# Patient Record
Sex: Female | Born: 1995
Health system: Southern US, Community
[De-identification: ages and names within clinical notes are randomized; demographics above are authoritative.]

## PROBLEM LIST (undated history)

## (undated) HISTORY — PX: TONSILLECTOMY: SUR1361

## (undated) HISTORY — PX: ADENOIDECTOMY: SUR15

---

## 2016-10-25 ENCOUNTER — Inpatient Hospital Stay (HOSPITAL_COMMUNITY)
Admission: AD | Admit: 2016-10-25 | Discharge: 2016-10-26 | Disposition: A | Discharge status: Home / Self Care | Payer: 59 | Source: Ambulatory Visit | Attending: Family Medicine | Admitting: Family Medicine

## 2016-10-25 ENCOUNTER — Inpatient Hospital Stay (HOSPITAL_COMMUNITY): Payer: 59

## 2016-10-25 ENCOUNTER — Encounter (HOSPITAL_COMMUNITY): Payer: Self-pay | Admitting: *Deleted

## 2016-10-25 DIAGNOSIS — N946 Dysmenorrhea, unspecified: Secondary | ICD-10-CM | POA: Diagnosis not present

## 2016-10-25 DIAGNOSIS — R1031 Right lower quadrant pain: Secondary | ICD-10-CM | POA: Diagnosis present

## 2016-10-25 DIAGNOSIS — Z8742 Personal history of other diseases of the female genital tract: Secondary | ICD-10-CM

## 2016-10-25 DIAGNOSIS — R102 Pelvic and perineal pain: Secondary | ICD-10-CM | POA: Insufficient documentation

## 2016-10-25 DIAGNOSIS — G8929 Other chronic pain: Secondary | ICD-10-CM | POA: Insufficient documentation

## 2016-10-25 LAB — URINALYSIS, ROUTINE W REFLEX MICROSCOPIC
BILIRUBIN URINE: NEGATIVE
Bacteria, UA: NONE SEEN
GLUCOSE, UA: NEGATIVE mg/dL
Ketones, ur: NEGATIVE mg/dL
Leukocytes, UA: NEGATIVE
NITRITE: NEGATIVE
PROTEIN: NEGATIVE mg/dL
RBC / HPF: NONE SEEN RBC/hpf (ref 0–5)
SPECIFIC GRAVITY, URINE: 1 — AB (ref 1.005–1.030)
pH: 6 (ref 5.0–8.0)

## 2016-10-25 LAB — CBC
HEMATOCRIT: 36.6 % (ref 36.0–46.0)
Hemoglobin: 12.2 g/dL (ref 12.0–15.0)
MCH: 29.9 pg (ref 26.0–34.0)
MCHC: 33.3 g/dL (ref 30.0–36.0)
MCV: 89.7 fL (ref 78.0–100.0)
PLATELETS: 311 10*3/uL (ref 150–400)
RBC: 4.08 MIL/uL (ref 3.87–5.11)
RDW: 13.3 % (ref 11.5–15.5)
WBC: 8.2 10*3/uL (ref 4.0–10.5)

## 2016-10-25 NOTE — MAU Provider Note (Signed)
Chief Complaint: Abdominal Pain   First Provider Initiated Contact with Patient 10/25/16 2148      SUBJECTIVE HPI: Gloria Harper is a 21 y.o. G1P0010 who presents to maternity admissions reporting RLQ pain that is intermittent, starting 3-4 months ago.  This is a recurrent problem, usually occurring with her menses but also occurring in between bleeding. Her periods are irregular.  She had a Nexplanon that was removed 2 weeks ago by HD in [place]. She is not currently using contraception but is not sexually active.  She was seen initially 4 months ago and diagnosed with ovarian cysts and followed up with an OB/Gyn in [place]. She continues to have the pain so she became worried that something more serious could be wrong. She does not live in [place] but was visiting a friend so came to MAU.  She has tried Tylenol and ibuprofen, which do not resolve her pain and heat/warm bath which help but do not resolve the pain completely.  Having her period makes the pain worse.  It has no associated symptoms. She went to [facility] last night for this pain and had pelvic exam including STD testing. She also had recent Pap in her Ob/Gyn office which was normal per the pt. She denies vaginal bleeding, vaginal itching/burning, urinary symptoms, h/a, dizziness, n/v, or fever/chills.     HPI  History reviewed. No pertinent past medical history. Past Surgical History:  Procedure Laterality Date  . TONSILLECTOMY     Social History   Social History  . Marital status: Single    Spouse name: N/A  . Number of children: N/A  . Years of education: N/A   Occupational History  . Not on file.   Social History Main Topics  . Smoking status: Never Smoker  . Smokeless tobacco: Never Used  . Alcohol use Not on file  . Drug use: No  . Sexual activity: Not on file   Other Topics Concern  . Not on file   Social History Narrative  . No narrative on file   No current facility-administered medications  on file prior to encounter.    No current outpatient prescriptions on file prior to encounter.   No Known Allergies  ROS:  Review of Systems  Constitutional: Negative for chills, fatigue and fever.  Respiratory: Negative for shortness of breath.   Cardiovascular: Negative for chest pain.  Genitourinary: Positive for pelvic pain. Negative for difficulty urinating, dysuria, flank pain, vaginal bleeding, vaginal discharge and vaginal pain.  Neurological: Negative for dizziness and headaches.  Psychiatric/Behavioral: Negative.      I have reviewed patient's Past Medical Hx, Surgical Hx, Family Hx, Social Hx, medications and allergies.   Physical Exam   Patient Vitals for the past 24 hrs:  BP Temp Pulse Resp SpO2 Height Weight  10/25/16 2043 130/70 98.1 F (36.7 C) 91 18 100 % - -  10/25/16 2034 - - - - - 5\' 7"  (1.702 m) 220 lb (99.8 kg)   Constitutional: Well-developed, well-nourished female in no acute distress.  Cardiovascular: normal rate Respiratory: normal effort GI: Abd soft, non-tender. Pos BS x 4 MS: Extremities nontender, no edema, normal ROM Neurologic: Alert and oriented x 4.  GU: Neg CVAT.  PELVIC EXAM: Deferred--done yesterday at [facility] per pt including cultures. She also had recent Pap in her Ob/Gyn office.   LAB RESULTS Results for orders placed or performed during the hospital encounter of 10/25/16 (from the past 24 hour(s))  Urinalysis, Routine w reflex microscopic  Status: Abnormal   Collection Time: 10/25/16  8:29 PM  Result Value Ref Range   Color, Urine COLORLESS (A) YELLOW   APPearance CLEAR CLEAR   Specific Gravity, Urine 1.000 (L) 1.005 - 1.030   pH 6.0 5.0 - 8.0   Glucose, UA NEGATIVE NEGATIVE mg/dL   Hgb urine dipstick LARGE (A) NEGATIVE   Bilirubin Urine NEGATIVE NEGATIVE   Ketones, ur NEGATIVE NEGATIVE mg/dL   Protein, ur NEGATIVE NEGATIVE mg/dL   Nitrite NEGATIVE NEGATIVE   Leukocytes, UA NEGATIVE NEGATIVE   RBC / HPF NONE  SEEN 0 - 5 RBC/hpf   WBC, UA 0-5 0 - 5 WBC/hpf   Bacteria, UA NONE SEEN NONE SEEN   Squamous Epithelial / LPF 0-5 (A) NONE SEEN  CBC     Status: None   Collection Time: 10/25/16 10:25 PM  Result Value Ref Range   WBC 8.2 4.0 - 10.5 K/uL   RBC 4.08 3.87 - 5.11 MIL/uL   Hemoglobin 12.2 12.0 - 15.0 g/dL   HCT 11.936.6 14.736.0 - 82.946.0 %   MCV 89.7 78.0 - 100.0 fL   MCH 29.9 26.0 - 34.0 pg   MCHC 33.3 30.0 - 36.0 g/dL   RDW 56.213.3 13.011.5 - 86.515.5 %   Platelets 311 150 - 400 K/uL       IMAGING [place] Transvaginal Non-ob  Result Date: 10/25/2016 CLINICAL DATA:  History of ovarian cyst. Chronic pelvic pain. Nexplanon removed 2 weeks ago. EXAM: TRANSABDOMINAL AND TRANSVAGINAL ULTRASOUND OF PELVIS TECHNIQUE: Both transabdominal and transvaginal ultrasound examinations of the pelvis were performed. Transabdominal technique was performed for global imaging of the pelvis including uterus, ovaries, adnexal regions, and pelvic cul-de-sac. It was necessary to proceed with endovaginal exam following the transabdominal exam to visualize the ovaries. COMPARISON:  None FINDINGS: Uterus Measurements: 8.3 x 3.9 x 5.1 cm. Uterus is anteverted. No fibroids or other mass visualized. Endometrium Thickness: 2.5 mm.  No focal abnormality visualized. Right ovary Measurements: 3.8 x 2.7 x 2.9 cm. Normal follicular cysts demonstrated. Normal appearance/no adnexal mass. Left ovary Measurements: 3.8 x 2.8 x 2.9 cm. Normal appearance/no adnexal mass. Other findings No abnormal free fluid. Flow is demonstrated in both ovaries on color flow Doppler imaging. IMPRESSION: Normal ultrasound appearance of the uterus and ovaries. Electronically Signed   By: Burman NievesWilliam  Stevens M.D.   On: 10/25/2016 23:24   [place] Pelvis Complete  Result Date: 10/25/2016 CLINICAL DATA:  History of ovarian cyst. Chronic pelvic pain. Nexplanon removed 2 weeks ago. EXAM: TRANSABDOMINAL AND TRANSVAGINAL ULTRASOUND OF PELVIS TECHNIQUE: Both transabdominal and transvaginal  ultrasound examinations of the pelvis were performed. Transabdominal technique was performed for global imaging of the pelvis including uterus, ovaries, adnexal regions, and pelvic cul-de-sac. It was necessary to proceed with endovaginal exam following the transabdominal exam to visualize the ovaries. COMPARISON:  None FINDINGS: Uterus Measurements: 8.3 x 3.9 x 5.1 cm. Uterus is anteverted. No fibroids or other mass visualized. Endometrium Thickness: 2.5 mm.  No focal abnormality visualized. Right ovary Measurements: 3.8 x 2.7 x 2.9 cm. Normal follicular cysts demonstrated. Normal appearance/no adnexal mass. Left ovary Measurements: 3.8 x 2.8 x 2.9 cm. Normal appearance/no adnexal mass. Other findings No abnormal free fluid. Flow is demonstrated in both ovaries on color flow Doppler imaging. IMPRESSION: Normal ultrasound appearance of the uterus and ovaries. Electronically Signed   By: Burman NievesWilliam  Stevens M.D.   On: 10/25/2016 23:24    MAU Management/MDM: Ordered labs and US and reviewed results.  No evidence of ovarian cysts or  torsion on today's [place].  No acute abdomen, CBC wnl with no s/sx of infection.  Pain with each menses likely dysmenorrhea without known cause.  Pt to f/u with her OB/Gyn for further evaluation/management.  Pt to take ibuprofen OTC for pain. Pt stable at time of discharge.  ASSESSMENT 1. Dysmenorrhea   2. Chronic pelvic pain in female   3. Hx of ovarian cyst     PLAN Discharge home   Allergies as of 10/26/2016   No Known Allergies     Medication List    TAKE these medications   acetaminophen 500 MG tablet Commonly known as:  TYLENOL Take 500 mg by mouth every 6 (six) hours as needed.   meloxicam 7.5 MG tablet Commonly known as:  MOBIC Take 7.5 mg by mouth daily.   traMADol 50 MG tablet Commonly known as:  ULTRAM Take 100 mg by mouth every 6 (six) hours as needed.      Follow-up Information    Your Ob/Gyn provider in [place] Follow up.           Sharen Counter Certified Nurse-Midwife 10/26/2016  12:33 AM

## 2016-10-25 NOTE — MAU Note (Signed)
Pt was seen in [place] for pain. Pt had same symptoms as when she was diagnosed in October with ovarian cysts. She is wondering why she is getting these cysts and what kind of cysts they are. Pt took Tramadol a couple hours ago with only little bit of relief. Pt started her period on Friday. Pt had naxplanon taken out 2 weeks ago because it was making her cycles irregular.

## 2016-10-25 NOTE — MAU Note (Addendum)
Pt states over the weekend she was at [facility] and was told she had a large ovarian cyst-has hx of ovarian cyst. States she is having pain- rates 10/10. States she was given Chong Sicilianramadol-last took a couple hours ago, and two other medications for inflammation and vaginal bleeding. Pt states she is currently having some bleeding-small amount. LMP: 10/23/2016. Had Nexplanon removed by HD in [place] about 2 weeks ago.

## 2016-10-26 DIAGNOSIS — N946 Dysmenorrhea, unspecified: Secondary | ICD-10-CM

## 2016-10-26 DIAGNOSIS — R1031 Right lower quadrant pain: Secondary | ICD-10-CM

## 2021-01-20 ENCOUNTER — Other Ambulatory Visit: Payer: Self-pay

## 2021-01-20 ENCOUNTER — Ambulatory Visit
Admission: RE | Admit: 2021-01-20 | Discharge: 2021-01-20 | Disposition: A | Discharge status: Home / Self Care | Payer: 59 | Source: Ambulatory Visit | Attending: Emergency Medicine | Admitting: Emergency Medicine

## 2021-01-20 VITALS — BP 128/79 | HR 79 | Temp 98.4°F | Resp 16

## 2021-01-20 DIAGNOSIS — H6001 Abscess of right external ear: Secondary | ICD-10-CM | POA: Diagnosis not present

## 2021-01-20 DIAGNOSIS — R0981 Nasal congestion: Secondary | ICD-10-CM

## 2021-01-20 MED ORDER — FLUCONAZOLE 150 MG PO TABS: 150.0000 mg | ORAL_TABLET | Freq: Once | ORAL | 0 refills | Status: AC

## 2021-01-20 MED ORDER — AMOXICILLIN-POT CLAVULANATE 875-125 MG PO TABS: 1.0000 | ORAL_TABLET | Freq: Two times a day (BID) | ORAL | 0 refills | Status: AC

## 2021-01-20 MED ORDER — PSEUDOEPH-BROMPHEN-DM 30-2-10 MG/5ML PO SYRP: 10.0000 mL | ORAL_SOLUTION | Freq: Three times a day (TID) | ORAL | 0 refills | Status: AC | PRN

## 2021-01-20 MED ORDER — NEOMYCIN-POLYMYXIN-HC 3.5-10000-1 OT SUSP: 4.0000 [drp] | Freq: Three times a day (TID) | OTIC | 0 refills | Status: AC

## 2021-01-20 MED ORDER — FLUTICASONE PROPIONATE 50 MCG/ACT NA SUSP: 1.0000 | Freq: Every day | NASAL | 0 refills | Status: DC

## 2021-01-20 NOTE — Discharge Instructions (Signed)
Begin Cortisporin eardrops 3-4 times a day Continue Claritin, add in Flonase nasal spray 1 to 2 spray in each nostril daily May use cough syrup provided as needed for cough Tylenol and ibuprofen for swollen lymph nodes, ear pain, warm compresses If not seeing any improvement in symptoms with the above over the next 3 to 4 days may fill prescription for Augmentin  Please follow-up for any concerns or persistent symptoms

## 2021-01-20 NOTE — ED Triage Notes (Signed)
Pt c/o sinus pressure, nasal congestion, and rt ear pain x3 days. States feels like her lymph nodes are swelling to side of neck.

## 2021-01-20 NOTE — ED Provider Notes (Signed)
[facility]    CSN: [id number] Arrival date & time: 01/20/21  1854      History   Chief Complaint Chief Complaint  Patient presents with  . appt 7 sinus pressure    HPI Gloria Harper is a 25 y.o. female presenting today for evaluation of possible sinus infection.  Reports that over the past 3 days she has had discomfort in right ear with associated sinus pressure congestion and swollen lymph nodes.  She denies associated sore throat.  Denies known fevers.  Denies close sick contacts.  Reports history of sinus infections annually.  HPI  History reviewed. No pertinent past medical history.  There are no problems to display for this patient.   Past Surgical History:  Procedure Laterality Date  . TONSILLECTOMY      OB History    Gravida  1   Para      Term      Preterm      AB  1   Living        SAB  1   IAB      Ectopic      Multiple      Live Births  0            Home Medications    Prior to Admission medications   Medication Sig Start Date End Date Taking? Authorizing Provider  amoxicillin-clavulanate (AUGMENTIN) 875-125 MG tablet Take 1 tablet by mouth every 12 (twelve) hours for 7 days. 01/20/21 01/27/21 Yes Jakevion Arney C, PA-C  brompheniramine-pseudoephedrine-DM 30-2-10 MG/5ML syrup Take 10 mLs by mouth 3 (three) times daily as needed. 01/20/21  Yes Eliah Marquard C, PA-C  fluticasone (FLONASE) 50 MCG/ACT nasal spray Place 1-2 sprays into both nostrils daily. 01/20/21  Yes Mahaila Tischer C, PA-C  neomycin-polymyxin-hydrocortisone (CORTISPORIN) 3.5-10000-1 OTIC suspension Place 4 drops into the right ear 3 (three) times daily for 7 days. 01/20/21 01/27/21 Yes Sallyann Kinnaird, Junius Creamer, PA-C    Family History Family History  Problem Relation Age of Onset  . Hypertension Sister   . Stroke Sister     Social History Social History   Tobacco Use  . Smoking status: Never Smoker  . Smokeless tobacco: Never Used  Substance Use Topics  .  Alcohol use: Yes    Comment: occ  . Drug use: No     Allergies   Patient has no known allergies.   Review of Systems Review of Systems  Constitutional: Negative for activity change, appetite change, chills, fatigue and fever.  HENT: Positive for congestion, ear pain and sinus pressure. Negative for rhinorrhea, sore throat and trouble swallowing.   Eyes: Negative for discharge and redness.  Respiratory: Positive for cough. Negative for chest tightness and shortness of breath.   Cardiovascular: Negative for chest pain.  Gastrointestinal: Negative for abdominal pain, diarrhea, nausea and vomiting.  Musculoskeletal: Negative for myalgias.  Skin: Negative for rash.  Neurological: Negative for dizziness, light-headedness and headaches.     Physical Exam Triage Vital Signs ED Triage Vitals  Enc Vitals Group     BP      Pulse      Resp      Temp      Temp src      SpO2      Weight      Height      Head Circumference      Peak Flow      Pain Score      Pain Loc  Pain Edu?      Excl. in GC?    No data found.  Updated Vital Signs BP 128/79 (BP Location: Left Arm)   Pulse 79   Temp 98.4 F (36.9 C) (Oral)   Resp 16   LMP 01/18/2021   SpO2 97%   Visual Acuity Right Eye Distance:   Left Eye Distance:   Bilateral Distance:    Right Eye Near:   Left Eye Near:    Bilateral Near:     Physical Exam Vitals and nursing note reviewed.  Constitutional:      Appearance: She is well-developed.     Comments: No acute distress  HENT:     Head: Normocephalic and atraumatic.     Ears:     Comments: Right canal with small area of erythema and swelling noted to posterior portion of canal just near opening with associated tenderness, otherwise canal without swelling or erythema, TM intact with good bony landmarks and cone of light     Nose: Nose normal.     Mouth/Throat:     Comments: Oral mucosa pink and moist, no tonsillar enlargement or exudate. Posterior pharynx  patent and nonerythematous, no uvula deviation or swelling. Normal phonation. Eyes:     Conjunctiva/sclera: Conjunctivae normal.  Neck:     Comments: Left superior anterior cervical chain lymphadenopathy Cardiovascular:     Rate and Rhythm: Normal rate.  Pulmonary:     Effort: Pulmonary effort is normal. No respiratory distress.     Comments: Breathing comfortably at rest, CTABL, no wheezing, rales or other adventitious sounds auscultated Abdominal:     General: There is no distension.  Musculoskeletal:        General: Normal range of motion.     Cervical back: Neck supple.  Skin:    General: Skin is warm and dry.  Neurological:     Mental Status: She is alert and oriented to person, place, and time.      UC Treatments / Results  Labs (all labs ordered are listed, but only abnormal results are displayed) Labs Reviewed  NOVEL CORONAVIRUS, NAA    EKG   Radiology No results found.  Procedures Procedures (including critical care time)  Medications Ordered in UC Medications - No data to display  Initial Impression / Assessment and Plan / UC Course  I have reviewed the triage vital signs and the nursing notes.  Pertinent labs & imaging results that were available during my care of the patient were reviewed by me and considered in my medical decision making (see chart for details).     1.  Right canal abscess/swelling-placing on Cortisporin topical drops recommending anti-inflammatories and close monitoring, warm compresses, no sign of otitis media at this time  2.  URI- symptoms x 3 days, likely viral etiology and recommending symptomatic and supportive care, deferring antibiotics at this time  Did provide printed prescription for Augmentin to fill if area in ear/sinus symptoms not improving with recommendations provided today over the next 3 to 4 days.  Continue to monitor,Discussed strict return precautions. Patient verbalized understanding and is agreeable with  plan.  Final Clinical Impressions(s) / UC Diagnoses   Final diagnoses:  Abscess of right ear canal  Sinus congestion     Discharge Instructions     Begin Cortisporin eardrops 3-4 times a day Continue Claritin, add in Flonase nasal spray 1 to 2 spray in each nostril daily May use cough syrup provided as needed for cough Tylenol and ibuprofen for swollen  lymph nodes, ear pain, warm compresses If not seeing any improvement in symptoms with the above over the next 3 to 4 days may fill prescription for Augmentin  Please follow-up for any concerns or persistent symptoms    ED Prescriptions    Medication Sig Dispense Auth. Provider   neomycin-polymyxin-hydrocortisone (CORTISPORIN) 3.5-10000-1 OTIC suspension Place 4 drops into the right ear 3 (three) times daily for 7 days. 10 mL Kirstein Baxley C, PA-C   fluticasone (FLONASE) 50 MCG/ACT nasal spray Place 1-2 sprays into both nostrils daily. 16 g Haydee Jabbour C, PA-C   brompheniramine-pseudoephedrine-DM 30-2-10 MG/5ML syrup Take 10 mLs by mouth 3 (three) times daily as needed. 120 mL Eryn Marandola C, PA-C   amoxicillin-clavulanate (AUGMENTIN) 875-125 MG tablet Take 1 tablet by mouth every 12 (twelve) hours for 7 days. 14 tablet Joann Jorge C, PA-C   fluconazole (DIFLUCAN) 150 MG tablet Take 1 tablet (150 mg total) by mouth once for 1 dose. 2 tablet Dalyah Pla, Sheldon C, PA-C     PDMP not reviewed this encounter.   Sharyon Cable [place] C, [place] 01/21/21 [phone]

## 2021-01-23 LAB — NOVEL CORONAVIRUS, NAA: SARS-CoV-2, NAA: NOT DETECTED

## 2021-01-23 LAB — SARS-COV-2, NAA 2 DAY TAT

## 2021-06-26 ENCOUNTER — Encounter (HOSPITAL_COMMUNITY): Payer: Self-pay | Admitting: Student

## 2021-06-26 ENCOUNTER — Other Ambulatory Visit: Payer: Self-pay

## 2021-06-26 ENCOUNTER — Emergency Department (HOSPITAL_COMMUNITY)
Admission: EM | Admit: 2021-06-26 | Discharge: 2021-06-27 | Disposition: A | Discharge status: Home / Self Care | Payer: Medicaid Other | Attending: Emergency Medicine | Admitting: Emergency Medicine

## 2021-06-26 DIAGNOSIS — Z3202 Encounter for pregnancy test, result negative: Secondary | ICD-10-CM | POA: Insufficient documentation

## 2021-06-26 DIAGNOSIS — R109 Unspecified abdominal pain: Secondary | ICD-10-CM | POA: Diagnosis not present

## 2021-06-26 LAB — BASIC METABOLIC PANEL
Anion gap: 5 (ref 5–15)
BUN: 10 mg/dL (ref 6–20)
CO2: 24 mmol/L (ref 22–32)
Calcium: 9 mg/dL (ref 8.9–10.3)
Chloride: 108 mmol/L (ref 98–111)
Creatinine, Ser: 0.74 mg/dL (ref 0.44–1.00)
GFR, Estimated: >60 mL/min (ref >60)
Glucose, Bld: 85 mg/dL (ref 70–99)
Potassium: 3.6 mmol/L (ref 3.5–5.1)
Sodium: 137 mmol/L (ref 135–145)

## 2021-06-26 LAB — URINALYSIS, ROUTINE W REFLEX MICROSCOPIC
Bilirubin Urine: NEGATIVE
Glucose, UA: NEGATIVE mg/dL
Hgb urine dipstick: NEGATIVE
Ketones, ur: NEGATIVE mg/dL
Leukocytes,Ua: NEGATIVE
Nitrite: NEGATIVE
Protein, ur: NEGATIVE mg/dL
Specific Gravity, Urine: 1.023 (ref 1.005–1.030)
pH: 6 (ref 5.0–8.0)

## 2021-06-26 LAB — CBC WITH DIFFERENTIAL/PLATELET
Abs Immature Granulocytes: 0.02 10*3/uL (ref 0.00–0.07)
Basophils Absolute: 0 10*3/uL (ref 0.0–0.1)
Basophils Relative: 0 %
Eosinophils Absolute: 0.1 10*3/uL (ref 0.0–0.5)
Eosinophils Relative: 1 %
HCT: 37.1 % (ref 36.0–46.0)
Hemoglobin: 11.6 g/dL — ABNORMAL LOW (ref 12.0–15.0)
Immature Granulocytes: 0 %
Lymphocytes Relative: 35 %
Lymphs Abs: 3 10*3/uL (ref 0.7–4.0)
MCH: 26.8 pg (ref 26.0–34.0)
MCHC: 31.3 g/dL (ref 30.0–36.0)
MCV: 85.7 fL (ref 80.0–100.0)
Monocytes Absolute: 0.6 10*3/uL (ref 0.1–1.0)
Monocytes Relative: 7 %
Neutro Abs: 4.9 10*3/uL (ref 1.7–7.7)
Neutrophils Relative %: 57 %
Platelets: 424 10*3/uL — ABNORMAL HIGH (ref 150–400)
RBC: 4.33 MIL/uL (ref 3.87–5.11)
RDW: 16.4 % — ABNORMAL HIGH (ref 11.5–15.5)
WBC: 8.6 10*3/uL (ref 4.0–10.5)
nRBC: 0 % (ref 0.0–0.2)

## 2021-06-26 LAB — I-STAT BETA HCG BLOOD, ED (MC, WL, AP ONLY): I-stat hCG, quantitative: <5 m[IU]/mL (ref <5)

## 2021-06-26 NOTE — ED Notes (Signed)
Pt ambulatory in ED lobby. 

## 2021-06-26 NOTE — ED Triage Notes (Signed)
Patient BIB POV.  Patient is 8 days last for her menstrual cycle.  Patient took 4 home pregnancy tests today, 2 were positive and 2 were negative.  Patient is having some lower pelvic pain that has now moved higher up on her bilateral sides and radiates to her flank area, denies any urinary symptoms.  Patient had a removal of what she thinks is cervix polyps in the past.  She is concerned that due to the pain she is experiencing that she might have an epiotic pregnancy. Denies any vaginal bleeding of any kind.

## 2021-06-26 NOTE — ED Provider Notes (Signed)
Emergency Medicine Provider Triage Evaluation Note  Gloria Harper , a 25 y.o. female  was evaluated in triage.  Pt complains of patient has not had a period September 5th. C/o bl pelvic pain. Patient had equivocal home pregnancy test. Concerned she could "have an ectopic pregnancy." No vaginal bleeding. Patient has hx of cervical polyps as well. No urinary sxs.  Review of Systems  Positive: Pelvic pain Negative: Vaginal bleeding  Physical Exam  BP 136/86 (BP Location: Left Arm)   Pulse 89   Temp 99.4 F (37.4 C) (Oral)   Resp 18   Ht 5\' 8"  (1.727 m)   SpO2 100%   BMI 33.45 kg/m  Gen:   Awake, no distress   Resp:  Normal effort  MSK:   Moves extremities without difficulty  Other:    Medical Decision Making  Medically screening exam initiated at 8:21 PM.  Appropriate orders placed.  Esmirna Ravan was informed that the remainder of the evaluation will be completed by another provider, this initial triage assessment does not replace that evaluation, and the importance of remaining in the ED until their evaluation is complete.  Labs and work up initiated.   Gabriela Eves, PA-C 06/26/21 2024    2025, MD 06/26/21 [phone]

## 2021-06-27 NOTE — ED Provider Notes (Signed)
[facility]-EMERGENCY DEPT Provider Note   CSN: [id number] Arrival date & time: 06/26/21  1841     History Chief Complaint  Patient presents with   Possible Pregnancy   Abdominal Pain    Gloria Harper is a 25 y.o. female presents to the emergency department with concerns for possible pregnancy.  Patient states that her last menstrual cycle was September 5.  She is sexually active with 1 female partner and does not use any form of contraception.  Reports that her menstrual cycles are normally fairly regular.  Patient states she had some abdominal cramping earlier this morning which has largely resolved.  No vaginal bleeding or vaginal discharge.  No fevers or chills.  Patient reports several indeterminate pregnancy tests at home.  No aggravating or alleviating factors.  No other concerns  The history is provided by the patient and medical records. No language interpreter was used.      History reviewed. No pertinent past medical history.  There are no problems to display for this patient.   Past Surgical History:  Procedure Laterality Date   TONSILLECTOMY       OB History     Gravida  1   Para      Term      Preterm      AB  1   Living         SAB  1   IAB      Ectopic      Multiple      Live Births  0           Family History  Problem Relation Age of Onset   Hypertension Sister    Stroke Sister     Social History   Tobacco Use   Smoking status: Never   Smokeless tobacco: Never  Substance Use Topics   Alcohol use: Yes    Comment: occ   Drug use: No    Home Medications Prior to Admission medications   Medication Sig Start Date End Date Taking? Authorizing Provider  brompheniramine-pseudoephedrine-DM 30-2-10 MG/5ML syrup Take 10 mLs by mouth 3 (three) times daily as needed. 01/20/21   Wieters, Hallie C, PA-C  fluticasone (FLONASE) 50 MCG/ACT nasal spray Place 1-2 sprays into both nostrils daily. 01/20/21   Wieters, Hallie  C, PA-C    Allergies    Patient has no known allergies.  Review of Systems   Review of Systems  Constitutional:  Negative for appetite change, diaphoresis, fatigue, fever and unexpected weight change.  HENT:  Negative for mouth sores.   Eyes:  Negative for visual disturbance.  Respiratory:  Negative for cough, chest tightness, shortness of breath and wheezing.   Cardiovascular:  Negative for chest pain.  Gastrointestinal:  Positive for abdominal pain. Negative for constipation, diarrhea, nausea and vomiting.  Endocrine: Negative for polydipsia, polyphagia and polyuria.  Genitourinary:  Positive for menstrual problem. Negative for dysuria, frequency, hematuria and urgency.  Musculoskeletal:  Negative for back pain and neck stiffness.  Skin:  Negative for rash.  Allergic/Immunologic: Negative for immunocompromised state.  Neurological:  Negative for syncope, light-headedness and headaches.  Hematological:  Does not bruise/bleed easily.  Psychiatric/Behavioral:  Negative for sleep disturbance. The patient is not nervous/anxious.    Physical Exam Updated Vital Signs BP 136/86 (BP Location: Left Arm)   Pulse 89   Temp 99.4 F (37.4 C) (Oral)   Resp 18   Ht 5\' 8"  (1.727 m)   Wt 100 kg   LMP  05/19/2021   SpO2 100%   BMI 33.52 kg/m   Physical Exam Vitals and nursing note reviewed.  Constitutional:      General: She is not in acute distress.    Appearance: She is not diaphoretic.  HENT:     Head: Normocephalic.  Eyes:     General: No scleral icterus.    Conjunctiva/sclera: Conjunctivae normal.  Cardiovascular:     Rate and Rhythm: Normal rate and regular rhythm.     Pulses: Normal pulses.          Radial pulses are 2+ on the right side and 2+ on the left side.  Pulmonary:     Effort: No tachypnea, accessory muscle usage, prolonged expiration, respiratory distress or retractions.     Breath sounds: No stridor.     Comments: Equal chest rise. No increased work of  breathing. Abdominal:     General: There is no distension.     Palpations: Abdomen is soft.     Tenderness: There is no abdominal tenderness. There is no guarding or rebound.  Musculoskeletal:     Cervical back: Normal range of motion.     Comments: Moves all extremities equally and without difficulty.  Skin:    General: Skin is warm and dry.     Capillary Refill: Capillary refill takes less than 2 seconds.  Neurological:     Mental Status: She is alert.     GCS: GCS eye subscore is 4. GCS verbal subscore is 5. GCS motor subscore is 6.     Comments: Speech is clear and goal oriented.  Psychiatric:        Mood and Affect: Mood normal.    ED Results / Procedures / Treatments   Labs (all labs ordered are listed, but only abnormal results are displayed) Labs Reviewed  CBC WITH DIFFERENTIAL/PLATELET - Abnormal; Notable for the following components:      Result Value   Hemoglobin 11.6 (*)    RDW 16.4 (*)    Platelets 424 (*)    All other components within normal limits  URINALYSIS, ROUTINE W REFLEX MICROSCOPIC - Abnormal; Notable for the following components:   APPearance HAZY (*)    All other components within normal limits  BASIC METABOLIC PANEL  I-STAT BETA HCG BLOOD, ED ([facility], [facility], [facility] ONLY)    Procedures Procedures   Medications Ordered in ED Medications - No data to display  ED Course  I have reviewed the triage vital signs and the nursing notes.  Pertinent labs & imaging results that were available during my care of the patient were reviewed by me and considered in my medical decision making (see chart for details).    MDM Rules/Calculators/A&P                           Patient presents with resolved abdominal pain and concerns for possible pregnancy.  On exam abdomen soft and nontender.  Patient reports she is eating and drinking without difficulty.  Lab work here reassuring.  Pregnancy test negative.  Given hCG less than 5 highly doubt ectopic pregnancy.  I have  recommended patient to be evaluated by OB/GYN for dysfunctional uterine bleeding.  She has been referred.  Also discussed reasons to return to the emergency department.  Patient states understanding and is in agreement with the plan.   Final Clinical Impression(s) / ED Diagnoses Final diagnoses:  Negative pregnancy test  Abdominal cramping    Rx /  DC Orders ED Discharge Orders     None        Rito Lecomte, Boyd Kerbs 06/27/21 0206    Palumbo, April, MD 06/27/21 0300

## 2021-06-27 NOTE — Discharge Instructions (Addendum)
1. Medications: usual home medications 2. Treatment: rest, drink plenty of fluids, advance diet slowly 3. Follow Up: Please followup with your OB/GYN and/or primary doctor in 2 days for discussion of your diagnoses and further evaluation after today's visit; if you do not have a primary care doctor use the resource guide provided to find one; Please return to the ER for persistent vomiting, high fevers or worsening symptoms

## 2021-06-27 NOTE — ED Notes (Signed)
Pt expressed understanding of discharge instructions and received discharge papers.

## 2021-08-11 ENCOUNTER — Encounter: Payer: Self-pay | Admitting: Emergency Medicine

## 2021-08-11 ENCOUNTER — Ambulatory Visit
Admission: EM | Admit: 2021-08-11 | Discharge: 2021-08-11 | Disposition: A | Discharge status: Home / Self Care | Payer: BC Managed Care – PPO | Attending: Internal Medicine | Admitting: Internal Medicine

## 2021-08-11 ENCOUNTER — Other Ambulatory Visit: Payer: Self-pay

## 2021-08-11 DIAGNOSIS — J029 Acute pharyngitis, unspecified: Secondary | ICD-10-CM | POA: Diagnosis present

## 2021-08-11 DIAGNOSIS — J069 Acute upper respiratory infection, unspecified: Secondary | ICD-10-CM | POA: Insufficient documentation

## 2021-08-11 LAB — POCT RAPID STREP A (OFFICE): Rapid Strep A Screen: NEGATIVE

## 2021-08-11 MED ORDER — FLUTICASONE PROPIONATE 50 MCG/ACT NA SUSP: 1.0000 | Freq: Every day | NASAL | 0 refills | Status: DC

## 2021-08-11 MED ORDER — CETIRIZINE HCL 10 MG PO TABS: 10.0000 mg | ORAL_TABLET | Freq: Every day | ORAL | 0 refills | Status: DC

## 2021-08-11 MED ORDER — BENZONATATE 100 MG PO CAPS: 100.0000 mg | ORAL_CAPSULE | Freq: Three times a day (TID) | ORAL | 0 refills | Status: DC | PRN

## 2021-08-11 NOTE — Discharge Instructions (Signed)
It appears that you have a viral upper respiratory infection that should resolve in the next few days with symptomatic treatment.  You have been prescribed 3 medications to help alleviate symptoms.  Rapid strep test was negative.  Throat culture is pending.  COVID-19 and flu swab is pending as well.  We will call if it is positive.

## 2021-08-11 NOTE — ED Provider Notes (Signed)
[facility]    CSN: [id number] Arrival date & time: 08/11/21  1430      History   Chief Complaint Chief Complaint  Patient presents with   Influenza    HPI Gloria Harper is a 25 y.o. female.   Presents with nausea, vomiting, headache, body aches, cough, nasal congestion that started approximately 4 days ago.  Denies any known fevers or sick contacts.  Patient has been taking over-the-counter cough and cold medications and leftover amoxicillin with no improvement in symptoms.  Denies chest pain shortness of breath.   Influenza  History reviewed. No pertinent past medical history.  There are no problems to display for this patient.   Past Surgical History:  Procedure Laterality Date   TONSILLECTOMY      OB History     Gravida  1   Para      Term      Preterm      AB  1   Living         SAB  1   IAB      Ectopic      Multiple      Live Births  0            Home Medications    Prior to Admission medications   Medication Sig Start Date End Date Taking? Authorizing Provider  benzonatate (TESSALON) 100 MG capsule Take 1 capsule (100 mg total) by mouth every 8 (eight) hours as needed for cough. 08/11/21  Yes Weronika Birch, Acie Fredrickson, FNP  cetirizine (ZYRTEC) 10 MG tablet Take 1 tablet (10 mg total) by mouth daily for 10 days. 08/11/21 08/21/21 Yes Upton Russey, Acie Fredrickson, FNP  fluticasone (FLONASE) 50 MCG/ACT nasal spray Place 1 spray into both nostrils daily for 3 days. 08/11/21 08/14/21 Yes Avenir Lozinski, Acie Fredrickson, FNP  brompheniramine-pseudoephedrine-DM 30-2-10 MG/5ML syrup Take 10 mLs by mouth 3 (three) times daily as needed. 01/20/21   Wieters, Junius Creamer, PA-C    Family History Family History  Problem Relation Age of Onset   Hypertension Sister    Stroke Sister     Social History Social History   Tobacco Use   Smoking status: Never   Smokeless tobacco: Never  Substance Use Topics   Alcohol use: Yes    Comment: occ   Drug use: No     Allergies    Patient has no known allergies.   Review of Systems Review of Systems Per HPI  Physical Exam Triage Vital Signs ED Triage Vitals  Enc Vitals Group     BP 08/11/21 1559 118/77     Pulse Rate 08/11/21 1559 92     Resp 08/11/21 1559 16     Temp 08/11/21 1559 98.7 F (37.1 C)     Temp Source 08/11/21 1559 Oral     SpO2 08/11/21 1559 96 %     Weight --      Height --      Head Circumference --      Peak Flow --      Pain Score 08/11/21 1600 7     Pain Loc --      Pain Edu? --      Excl. in GC? --    No data found.  Updated Vital Signs BP 118/77 (BP Location: Right Arm)   Pulse 92   Temp 98.7 F (37.1 C) (Oral)   Resp 16   SpO2 96%   Visual Acuity Right Eye Distance:   Left Eye Distance:  Bilateral Distance:    Right Eye Near:   Left Eye Near:    Bilateral Near:     Physical Exam Constitutional:      General: She is not in acute distress.    Appearance: Normal appearance. She is not toxic-appearing or diaphoretic.  HENT:     Head: Normocephalic and atraumatic.     Right Ear: Ear canal normal. A middle ear effusion is present. Tympanic membrane is not perforated, erythematous or bulging.     Left Ear: Ear canal normal. A middle ear effusion is present. Tympanic membrane is not perforated, erythematous or bulging.     Nose: Congestion present.     Mouth/Throat:     Mouth: Mucous membranes are moist.     Pharynx: Posterior oropharyngeal erythema present.  Eyes:     Extraocular Movements: Extraocular movements intact.     Conjunctiva/sclera: Conjunctivae normal.     Pupils: Pupils are equal, round, and reactive to light.  Cardiovascular:     Rate and Rhythm: Normal rate and regular rhythm.     Pulses: Normal pulses.     Heart sounds: Normal heart sounds.  Pulmonary:     Effort: Pulmonary effort is normal. No respiratory distress.     Breath sounds: Normal breath sounds. No stridor. No wheezing, rhonchi or rales.  Abdominal:     General: Abdomen is flat.  Bowel sounds are normal.     Palpations: Abdomen is soft.  Musculoskeletal:        General: Normal range of motion.     Cervical back: Normal range of motion.  Skin:    General: Skin is warm and dry.  Neurological:     General: No focal deficit present.     Mental Status: She is alert and oriented to person, place, and time. Mental status is at baseline.  Psychiatric:        Mood and Affect: Mood normal.        Behavior: Behavior normal.     UC Treatments / Results  Labs (all labs ordered are listed, but only abnormal results are displayed) Labs Reviewed  COVID-19, FLU A+B NAA  CULTURE, GROUP A STREP [facility])  POCT RAPID STREP A (OFFICE)    EKG   Radiology No results found.  Procedures Procedures (including critical care time)  Medications Ordered in UC Medications - No data to display  Initial Impression / Assessment and Plan / UC Course  I have reviewed the triage vital signs and the nursing notes.  Pertinent labs & imaging results that were available during my care of the patient were reviewed by me and considered in my medical decision making (see chart for details).     Patient presents with symptoms likely from a viral upper respiratory infection. Differential includes bacterial pneumonia, sinusitis, allergic rhinitis, COVID-19, flu. Do not suspect underlying cardiopulmonary process. Symptoms seem unlikely related to ACS, CHF or COPD exacerbations, pneumonia, pneumothorax. Patient is nontoxic appearing and not in need of emergent medical intervention.  Rapid strep test is negative.  Throat culture and COVID-19 and flu test are pending.  Recommended symptom control with over the counter medications: Daily oral anti-histamine, Oral decongestant or IN corticosteroid, saline irrigations, cepacol lozenges, Robitussin, Delsym, honey tea.  Patient was offered prescriptions.  Return if symptoms fail to improve in 1-2 weeks or you develop shortness of breath, chest pain,  severe headache. Patient states understanding and is agreeable.  Discharged with PCP followup.  Final Clinical Impressions(s) / UC Diagnoses  Final diagnoses:  Viral upper respiratory tract infection with cough  Sore throat     Discharge Instructions      It appears that you have a viral upper respiratory infection that should resolve in the next few days with symptomatic treatment.  You have been prescribed 3 medications to help alleviate symptoms.  Rapid strep test was negative.  Throat culture is pending.  COVID-19 and flu swab is pending as well.  We will call if it is positive.    ED Prescriptions     Medication Sig Dispense Auth. Provider   cetirizine (ZYRTEC) 10 MG tablet Take 1 tablet (10 mg total) by mouth daily for 10 days. 30 tablet [place], [place] E, [place]   fluticasone [facility]) 50 MCG/ACT nasal spray Place 1 spray into both nostrils daily for 3 days. 16 g Solei Wubben, Rolly Salter E, [place]   benzonatate (TESSALON) 100 MG capsule Take 1 capsule (100 mg total) by mouth every 8 (eight) hours as needed for cough. 21 capsule [place], Acie Fredrickson, [place]      PDMP not reviewed this encounter.   Gustavus Bryant, [place] 08/11/21 [id number]

## 2021-08-11 NOTE — ED Triage Notes (Signed)
Nausea, vomiting, headache, generalized body aches, cough starting Friday. Has been taking OTC meds and the left over amoxicillin she had from a sinus infection without improvement.

## 2021-08-12 ENCOUNTER — Telehealth (HOSPITAL_COMMUNITY): Payer: Self-pay | Admitting: Emergency Medicine

## 2021-08-12 LAB — COVID-19, FLU A+B NAA
Influenza A, NAA: DETECTED — AB
Influenza B, NAA: NOT DETECTED
SARS-CoV-2, NAA: NOT DETECTED

## 2021-08-12 MED ORDER — CETIRIZINE HCL 10 MG PO TABS: 10.0000 mg | ORAL_TABLET | Freq: Every day | ORAL | 0 refills | Status: DC

## 2021-08-12 MED ORDER — BENZONATATE 100 MG PO CAPS: 100.0000 mg | ORAL_CAPSULE | Freq: Three times a day (TID) | ORAL | 0 refills | Status: AC | PRN

## 2021-08-12 MED ORDER — FLUTICASONE PROPIONATE 50 MCG/ACT NA SUSP: 1.0000 | Freq: Every day | NASAL | 0 refills | Status: AC

## 2021-08-12 NOTE — Telephone Encounter (Signed)
Patient asked that prescriptions be resent to a pharmacy that is open later

## 2021-08-13 ENCOUNTER — Telehealth (HOSPITAL_COMMUNITY): Payer: Self-pay | Admitting: Emergency Medicine

## 2021-08-13 LAB — CULTURE, GROUP A STREP (THRC)

## 2021-08-13 MED ORDER — PENICILLIN V POTASSIUM 500 MG PO TABS: 500.0000 mg | ORAL_TABLET | Freq: Two times a day (BID) | ORAL | 0 refills | Status: AC

## 2022-02-05 ENCOUNTER — Emergency Department (HOSPITAL_COMMUNITY)
Admission: EM | Admit: 2022-02-05 | Discharge: 2022-02-05 | Disposition: A | Discharge status: Home / Self Care | Payer: Medicaid Other | Attending: Emergency Medicine | Admitting: Emergency Medicine

## 2022-02-05 ENCOUNTER — Encounter (HOSPITAL_COMMUNITY): Payer: Self-pay

## 2022-02-05 ENCOUNTER — Other Ambulatory Visit: Payer: Self-pay

## 2022-02-05 DIAGNOSIS — D649 Anemia, unspecified: Secondary | ICD-10-CM | POA: Diagnosis not present

## 2022-02-05 DIAGNOSIS — K649 Unspecified hemorrhoids: Secondary | ICD-10-CM | POA: Diagnosis not present

## 2022-02-05 DIAGNOSIS — M5442 Lumbago with sciatica, left side: Secondary | ICD-10-CM | POA: Insufficient documentation

## 2022-02-05 DIAGNOSIS — Z3201 Encounter for pregnancy test, result positive: Secondary | ICD-10-CM | POA: Insufficient documentation

## 2022-02-05 DIAGNOSIS — K625 Hemorrhage of anus and rectum: Secondary | ICD-10-CM | POA: Diagnosis present

## 2022-02-05 DIAGNOSIS — R509 Fever, unspecified: Secondary | ICD-10-CM | POA: Insufficient documentation

## 2022-02-05 DIAGNOSIS — N9489 Other specified conditions associated with female genital organs and menstrual cycle: Secondary | ICD-10-CM | POA: Insufficient documentation

## 2022-02-05 LAB — URINALYSIS, ROUTINE W REFLEX MICROSCOPIC
Bilirubin Urine: NEGATIVE
Glucose, UA: NEGATIVE mg/dL
Hgb urine dipstick: NEGATIVE
Ketones, ur: NEGATIVE mg/dL
Nitrite: NEGATIVE
Protein, ur: NEGATIVE mg/dL
Specific Gravity, Urine: 1.018 (ref 1.005–1.030)
pH: 5 (ref 5.0–8.0)

## 2022-02-05 LAB — CBC WITH DIFFERENTIAL/PLATELET
Abs Immature Granulocytes: 0.02 10*3/uL (ref 0.00–0.07)
Basophils Absolute: 0 10*3/uL (ref 0.0–0.1)
Basophils Relative: 0 %
Eosinophils Absolute: 0 10*3/uL (ref 0.0–0.5)
Eosinophils Relative: 0 %
HCT: 35.3 % — ABNORMAL LOW (ref 36.0–46.0)
Hemoglobin: 11.7 g/dL — ABNORMAL LOW (ref 12.0–15.0)
Immature Granulocytes: 0 %
Lymphocytes Relative: 16 %
Lymphs Abs: 1.5 10*3/uL (ref 0.7–4.0)
MCH: 28.1 pg (ref 26.0–34.0)
MCHC: 33.1 g/dL (ref 30.0–36.0)
MCV: 84.9 fL (ref 80.0–100.0)
Monocytes Absolute: 0.6 10*3/uL (ref 0.1–1.0)
Monocytes Relative: 6 %
Neutro Abs: 7.6 10*3/uL (ref 1.7–7.7)
Neutrophils Relative %: 78 %
Platelets: 334 10*3/uL (ref 150–400)
RBC: 4.16 MIL/uL (ref 3.87–5.11)
RDW: 16.4 % — ABNORMAL HIGH (ref 11.5–15.5)
WBC: 9.7 10*3/uL (ref 4.0–10.5)
nRBC: 0 % (ref 0.0–0.2)

## 2022-02-05 LAB — BASIC METABOLIC PANEL
Anion gap: 7 (ref 5–15)
BUN: 9 mg/dL (ref 6–20)
CO2: 23 mmol/L (ref 22–32)
Calcium: 9.3 mg/dL (ref 8.9–10.3)
Chloride: 108 mmol/L (ref 98–111)
Creatinine, Ser: 0.84 mg/dL (ref 0.44–1.00)
GFR, Estimated: >60 mL/min (ref >60)
Glucose, Bld: 125 mg/dL — ABNORMAL HIGH (ref 70–99)
Potassium: 3.4 mmol/L — ABNORMAL LOW (ref 3.5–5.1)
Sodium: 138 mmol/L (ref 135–145)

## 2022-02-05 LAB — I-STAT BETA HCG BLOOD, ED (MC, WL, AP ONLY): I-stat hCG, quantitative: >2000 m[IU]/mL — ABNORMAL HIGH (ref <5)

## 2022-02-05 MED ORDER — LIDOCAINE 5 % EX PTCH
1.0000 | MEDICATED_PATCH | CUTANEOUS | Status: DC
  Administered 2022-02-05: 1 via TRANSDERMAL
  Filled 2022-02-05: qty 1

## 2022-02-05 MED ORDER — LIDOCAINE 4 % EX PTCH: 1.0000 | MEDICATED_PATCH | CUTANEOUS | 0 refills | Status: AC

## 2022-02-05 NOTE — ED Provider Notes (Signed)
[facility] Provider Note   CSN: [id number] Arrival date & time: 02/05/22  1814     History Chief Complaint  Patient presents with   Back Pain   Blood In Stools    Gloria Harper is a 26 y.o. female otherwise healthy presents to the ED for evaluation of left sided lower back pain as well as rectal bleeding.  Additionally, patient mentions she had a at home positive pregnancy test on Sunday.  She reports that she has had left-sided back pain intermittently for the past 3 days but is worse with her standing and changing positions.  She denies any urinary incontinence, fecal incontinence, urinary retention, fevers, dysuria, hematuria, vaginal bleeding, vaginal discharge.  She reports the pain wraps around to her lower leg and goes down to the knee.  For the past 2 days, she has noticed a few drops of blood in the toilet whenever she has a bowel movement.  She reports she has been constipated recently has been tried taking a stool softener.  She notices a few drops of blood in the toilet and also when wiping.  This has been going on for 2 days.  She does have some rectal pain as well.  She denies any fever, abdominal pain, vaginal bleeding, vaginal discharge.  She denies any melena.  She denies any medical history.  Surgical history includes a T&A. No daily medications.  No known drug allergies.  Marijuana smoker. Denies any IVDU ever.   Back Pain Associated symptoms: no abdominal pain, no chest pain, no dysuria, no fever, no headaches and no weakness       Home Medications Prior to Admission medications   Medication Sig Start Date End Date Taking? Authorizing Provider  benzonatate (TESSALON) 100 MG capsule Take 1 capsule (100 mg total) by mouth every 8 (eight) hours as needed for cough. 08/12/21   LampteyBritta Mccreedy, MD  brompheniramine-pseudoephedrine-DM 30-2-10 MG/5ML syrup Take 10 mLs by mouth 3 (three) times daily as needed. 01/20/21   Wieters, Hallie C, PA-C   cetirizine (ZYRTEC) 10 MG tablet Take 1 tablet (10 mg total) by mouth daily for 10 days. 08/12/21 08/22/21  Merrilee Jansky, MD  fluticasone (FLONASE) 50 MCG/ACT nasal spray Place 1 spray into both nostrils daily for 3 days. 08/12/21 08/15/21  Merrilee Jansky, MD      Allergies    Patient has no known allergies.    Review of Systems   Review of Systems  Constitutional:  Negative for chills and fever.  Respiratory:  Negative for shortness of breath.   Cardiovascular:  Negative for chest pain.  Gastrointestinal:  Positive for anal bleeding, nausea and rectal pain. Negative for abdominal pain, constipation, diarrhea and vomiting.  Genitourinary:  Negative for dysuria, frequency, hematuria and urgency.  Musculoskeletal:  Positive for back pain.  Neurological:  Negative for weakness and headaches.   Physical Exam Updated Vital Signs BP 131/68   Pulse 96   Temp 98.1 F (36.7 C) (Oral)   Resp 17   Ht 5\' 7"  (1.702 m)   Wt 90.7 kg   LMP 12/24/2021 (Approximate)   SpO2 99%   BMI 31.32 kg/m  Physical Exam Vitals and nursing note reviewed. Exam conducted with a chaperone present 02/23/2022, RN).  Constitutional:      General: She is not in acute distress.    Appearance: Normal appearance. She is not ill-appearing or toxic-appearing.  HENT:     Head: Normocephalic and atraumatic.  Eyes:  General: No scleral icterus. Cardiovascular:     Rate and Rhythm: Normal rate and regular rhythm.  Pulmonary:     Effort: Pulmonary effort is normal. No respiratory distress.     Breath sounds: Normal breath sounds.  Abdominal:     General: Bowel sounds are normal.     Palpations: Abdomen is soft.     Tenderness: There is no abdominal tenderness. There is no guarding or rebound.     Comments: No abdominal tenderness to palpation.  Normal active bowel sounds.  Abdomen is soft  Genitourinary:    Comments: Hemorrhoid noted at the 12 o clock position. Not thrombosed. She does have pain on  palpation.  Musculoskeletal:        General: No deformity.     Cervical back: Normal range of motion.     Right lower leg: No edema.     Left lower leg: No edema.     Comments: No midline or paraspinal cervical, thoracic, or lumbar tenderness to palpation.  The patient does have lower SI tenderness on the left.  No overlying skin changes other than lower back tattoo.  No overlying erythema or warmth to the area.  Compartments are soft.  Palpable pulses.  Sensation intact bilaterally.  Positive straight leg raise on the left.  Skin:    General: Skin is warm and dry.  Neurological:     General: No focal deficit present.     Mental Status: She is alert. Mental status is at baseline.    ED Results / Procedures / Treatments   Labs (all labs ordered are listed, but only abnormal results are displayed) Labs Reviewed  CBC WITH DIFFERENTIAL/PLATELET - Abnormal; Notable for the following components:      Result Value   Hemoglobin 11.7 (*)    HCT 35.3 (*)    RDW 16.4 (*)    All other components within normal limits  BASIC METABOLIC PANEL - Abnormal; Notable for the following components:   Potassium 3.4 (*)    Glucose, Bld 125 (*)    All other components within normal limits  I-STAT BETA HCG BLOOD, ED ([facility], [facility], [facility] ONLY) - Abnormal; Notable for the following components:   I-stat hCG, quantitative >2,000.0 (*)    All other components within normal limits  URINALYSIS, ROUTINE W REFLEX MICROSCOPIC    EKG None  Radiology No results found.  Procedures Procedures   Medications Ordered in ED Medications  lidocaine (LIDODERM) 5 % 1 patch (has no administration in time range)    ED Course/ Medical Decision Making/ A&P                           Medical Decision Making Risk Prescription drug management.   26 year old female presents to the emergency department for evaluation of left-sided lower back pain as well as some rectal bleeding.  Differential diagnosis includes was not limited  to cauda equina, MSK, strain, sprain, fracture, sciatica, hemorrhoid, GI bleed, anal fissure, vaginal bleeding.  Vital signs are unremarkable.  Patient normotensive, afebrile, normal pulse rate, satting well room air without increased work of breathing.  Physical exam is pertinent for no midline or paraspinal cervical, thoracic, or lumbar tenderness to palpation.  The patient does have lower SI tenderness on the left.  No overlying skin changes other than lower back tattoo.  No overlying erythema or warmth to the area.  Compartments are soft.  Palpable pulses.  Sensation intact bilaterally.  Positive straight  leg raise on the left.  Hemorrhoid noted at the 12 o'clock position.  Not thrombosed.  Tender to palpation.  No bleeding visualized.  Clay colored stool.  No abdominal tenderness palpation.  Abdomen is soft without any guarding or rebound.  Normal active bowel sounds.  Lidocaine patch ordered for patient's SI joint pain.  I independently reviewed and interpreted the patient's labs.  CBC shows mild anemia 11.7 otherwise consistent with patient's previous.  Urinalysis shows hazy urine with trace leukocytes although there is 0-5 white blood cells seen.  There is some rare bacteria but 6-10 squamous cells.  Likely dirty catch.  BMP shows mildly decreased potassium 3.4.  Elevated glucose at 125 although not fasting.  I-STAT beta-hCG is positive.  Any cauda equina given patient does not have any urinary fecal incontinence.  Pending epidural abscess the patient has stable vital signs, no leukocytosis, and denies any IV drug use.  Exam is more consistent with sciatica.  She likely has bleeding from her external hemorrhoid that is tender given her recent constipation and physical exam showing a hemorrhoid.  No fissures seen.  She has no abdominal tenderness, I do not think any other imaging is necessary at this time.  She is likely having some rectal bleeding due to the hemorrhoid.  For this, I discussed using  topical lidocaine and witch hazel for her hemorrhoid.  We also discussed drinking plenty of water and also taking MiraLAX make sure stool is soft so it causes least amount of pain and hopefully does not irritate the hemorrhoid.  We discussed for her lower back pain which is more consistent with sciatica given the positive blood strain and also tenderness over the SI joint.  We discussed using lidocaine patches topically as well as taking Tylenol as needed for pain.  We discussed gentle stretching.  Additionally, the patient take a daily multivitamin because of her positive pregnancy test.  Recommended that she follow-up with an OB/GYN to schedule an appointment.  Her to not use ibuprofen or naproxen because these are contraindicated in pregnancy.  Strict return precautions and red flag symptoms were discussed.  Patient verbalized understanding and agrees to plan.  Patient is stable being discharged home in good condition.   Final Clinical Impression(s) / ED Diagnoses Final diagnoses:  Hemorrhoids, unspecified hemorrhoid type  Acute left-sided low back pain with left-sided sciatica  Positive pregnancy test    Rx / DC Orders ED Discharge Orders          Ordered    lidocaine (HM LIDOCAINE PATCH) 4 %  Every 24 hours        02/05/22 2022              Achille Rich, PA-C 02/05/22 2045    Jacalyn Lefevre, MD 02/05/22 2307

## 2022-02-05 NOTE — ED Notes (Signed)
An After Visit Summary was printed and given to the patient. Discharge instructions given and no further questions at this time.  

## 2022-02-05 NOTE — Discharge Instructions (Addendum)
You were seen in the emergency department for evaluation of your low back pain as well as your rectal bleeding.  It was discovered that you have a hemorrhoid which is the likely source of your bleeding.  For this, you will need to make sure your stool is soft.  Please make sure you are drinking plenty of fluids, mainly water.  You can add things such as MiraLAX to help soften your stool.  This will help make the stool soft and make it less painful to produce a bowel movement.  Additionally, you pick up a topical lidocaine spray at your local grocery store or drugstore to spray to the area to help with your pain as well. If you are not having an increase in your rectal bleeding, you will need to return to the emergency department for evaluation.  For your back pain, it is likely consistent with something called sciatica.  For this, I have given you a prescription for lidocaine patches which you can apply to the area as needed.  If for diversion your insurance does not cover these, you can pick these up over-the-counter.  Ibuprofen is not recommended in pregnancy.  You can take Tylenol as needed for pain.  If you have any problem controlling your bowel or bladder, any fever, or any trouble with urination, you will need to return the emergency department for reevaluation.  Sure you are taking a prenatal vitamin because your positive pregnancy test.  Please make sure to follow-up with an OB/GYN to schedule an appointment.  Contact a doctor if: You have pain that: Wakes you up when you are sleeping. Gets worse when you lie down. Is worse than the pain you have had in the past. Lasts longer than 4 weeks. You lose weight without trying. Get help right away if: You cannot control when you pee (urinate) or poop (have a bowel movement). You have weakness in any of these areas and it gets worse: Lower back. The area between your hip bones. Butt. Legs. You have redness or swelling of your back. You have a  burning feeling when you pee.

## 2022-02-05 NOTE — ED Provider Triage Note (Signed)
Emergency Medicine Provider Triage Evaluation Note  Gloria Harper , a 26 y.o. female  was evaluated in triage.  Pt complains of rectal bleeding.  Patient states that for the past 12 hours she has been having bright red blood per rectum every time she has to have a bowel movement.  She complains of sharp pain in her left back that radiates to the front of her abdomen.  Noted to have a positive pregnancy test last week.  Patient states she is having 10 out of 10 rectal pain.  Review of Systems  Positive: Rectal bleeding Negative: Vaginal bleeding or urinary symptoms  Physical Exam  BP 124/73   Pulse (!) 103   Temp 98.1 F (36.7 C) (Oral)   Resp 16   SpO2 98%  Gen:   Awake, no distress   Resp:  Normal effort  MSK:   Moves extremities without difficulty  Other:  Patient sitting on her right buttock bleeding off of her bottom  Medical Decision Making  Medically screening exam initiated at 6:25 PM.  Appropriate orders placed.  Briellah Baik was informed that the remainder of the evaluation will be completed by another provider, this initial triage assessment does not replace that evaluation, and the importance of remaining in the ED until their evaluation is complete.  Work-up initiated   Arthor Captain, PA-C 02/08/22 3903

## 2022-02-05 NOTE — ED Triage Notes (Addendum)
Patient states she had a home pregnancy test and noted that it was positive.  Patient c/o left lower back pain, rectal pain,and reports bright red blood in her stool x 2 days.

## 2022-02-07 LAB — URINE CULTURE: Culture: >=100000 — AB

## 2022-02-08 ENCOUNTER — Telehealth (HOSPITAL_BASED_OUTPATIENT_CLINIC_OR_DEPARTMENT_OTHER): Payer: Self-pay | Admitting: *Deleted

## 2022-02-08 NOTE — Telephone Encounter (Signed)
Post ED Visit - Positive Culture Follow-up: Unsuccessful Patient Follow-up  Culture assessed and recommendations reviewed by:  [x]  Dia Sitter, Pharm.D. []  Heide Guile, Pharm.D., BCPS AQ-ID []  Parks Neptune, Pharm.D., BCPS []  Alycia Rossetti, Pharm.D., BCPS []  [place], Pharm.D., BCPS, AAHIVP []  Legrand Como, Pharm.D., BCPS, AAHIVP []  Wynell Balloon, PharmD []  Vincenza Hews, PharmD, BCPS  Positive urine culture  [x]  Patient discharged without antimicrobial prescription and treatment is now indicated []  Organism is resistant to prescribed ED discharge antimicrobial []  Patient with positive blood cultures  Plan: Amoxicillin 500mg  TID x 7 days  Unable to contact patient  letter will be sent to address on file  Rosie Fate 02/08/2022, 2:09 PM

## 2022-02-08 NOTE — Progress Notes (Signed)
ED Antimicrobial Stewardship Positive Culture Follow Up   Gloria Harper is an 26 y.o. female who presented to [facility] on 02/05/2022 with a chief complaint of  Chief Complaint  Patient presents with   Back Pain   Blood In Stools    Recent Results (from the past 720 hour(s))  Urine Culture     Status: Abnormal   Collection Time: 02/05/22  7:07 PM   Specimen: Urine, Clean Catch  Result Value Ref Range Status   Specimen Description   Final    URINE, CLEAN CATCH Performed at [facility] [address], [place], [place] [address]    Special Requests   Final    NONE Performed at [facility], [place] [address] [place], [place] [address]    Culture (A)  Final    >=100,000 COLONIES/mL LACTOBACILLUS SPECIES Standardized susceptibility testing for this organism is not available. Performed at [facility], [place] [address] [place], [place] [address]    Report Status 02/07/2022 FINAL  Final   Plan: 1) start amoxicillin 500 mg TID for 7days   ED Provider: Paulita Cradle, PA-C   Lynelle Doctor 02/08/2022, 8:59 AM Clinical Pharmacist [phone]

## 2022-02-24 ENCOUNTER — Emergency Department (HOSPITAL_COMMUNITY): Payer: Medicaid Other

## 2022-02-24 ENCOUNTER — Emergency Department (HOSPITAL_COMMUNITY)
Admission: EM | Admit: 2022-02-24 | Discharge: 2022-02-24 | Disposition: A | Discharge status: Home / Self Care | Payer: Medicaid Other | Attending: Emergency Medicine | Admitting: Emergency Medicine

## 2022-02-24 ENCOUNTER — Other Ambulatory Visit: Payer: Self-pay

## 2022-02-24 ENCOUNTER — Encounter (HOSPITAL_COMMUNITY): Payer: Self-pay | Admitting: Emergency Medicine

## 2022-02-24 DIAGNOSIS — O209 Hemorrhage in early pregnancy, unspecified: Secondary | ICD-10-CM | POA: Diagnosis present

## 2022-02-24 DIAGNOSIS — R112 Nausea with vomiting, unspecified: Secondary | ICD-10-CM

## 2022-02-24 DIAGNOSIS — N9489 Other specified conditions associated with female genital organs and menstrual cycle: Secondary | ICD-10-CM | POA: Insufficient documentation

## 2022-02-24 DIAGNOSIS — Z3A08 8 weeks gestation of pregnancy: Secondary | ICD-10-CM | POA: Insufficient documentation

## 2022-02-24 DIAGNOSIS — O219 Vomiting of pregnancy, unspecified: Secondary | ICD-10-CM | POA: Diagnosis not present

## 2022-02-24 DIAGNOSIS — O418X10 Other specified disorders of amniotic fluid and membranes, first trimester, not applicable or unspecified: Secondary | ICD-10-CM

## 2022-02-24 DIAGNOSIS — O4691 Antepartum hemorrhage, unspecified, first trimester: Secondary | ICD-10-CM | POA: Diagnosis not present

## 2022-02-24 DIAGNOSIS — R1013 Epigastric pain: Secondary | ICD-10-CM | POA: Diagnosis not present

## 2022-02-24 LAB — RH IG WORKUP (INCLUDES ABO/RH)
ABO/RH(D): O POS
Gestational Age(Wks): 10

## 2022-02-24 LAB — COMPREHENSIVE METABOLIC PANEL
ALT: 13 U/L (ref 0–44)
AST: 15 U/L (ref 15–41)
Albumin: 4 g/dL (ref 3.5–5.0)
Alkaline Phosphatase: 82 U/L (ref 38–126)
Anion gap: 8 (ref 5–15)
BUN: 7 mg/dL (ref 6–20)
CO2: 19 mmol/L — ABNORMAL LOW (ref 22–32)
Calcium: 9.3 mg/dL (ref 8.9–10.3)
Chloride: 108 mmol/L (ref 98–111)
Creatinine, Ser: 0.58 mg/dL (ref 0.44–1.00)
GFR, Estimated: >60 mL/min (ref >60)
Glucose, Bld: 81 mg/dL (ref 70–99)
Potassium: 3.5 mmol/L (ref 3.5–5.1)
Sodium: 135 mmol/L (ref 135–145)
Total Bilirubin: 0.7 mg/dL (ref 0.3–1.2)
Total Protein: 7.6 g/dL (ref 6.5–8.1)

## 2022-02-24 LAB — URINALYSIS, ROUTINE W REFLEX MICROSCOPIC
Bilirubin Urine: NEGATIVE
Glucose, UA: NEGATIVE mg/dL
Hgb urine dipstick: NEGATIVE
Ketones, ur: 20 mg/dL — AB
Leukocytes,Ua: NEGATIVE
Nitrite: NEGATIVE
Protein, ur: NEGATIVE mg/dL
Specific Gravity, Urine: 1.013 (ref 1.005–1.030)
pH: 5 (ref 5.0–8.0)

## 2022-02-24 LAB — CBC
HCT: 36.5 % (ref 36.0–46.0)
Hemoglobin: 11.9 g/dL — ABNORMAL LOW (ref 12.0–15.0)
MCH: 28.2 pg (ref 26.0–34.0)
MCHC: 32.6 g/dL (ref 30.0–36.0)
MCV: 86.5 fL (ref 80.0–100.0)
Platelets: 381 10*3/uL (ref 150–400)
RBC: 4.22 MIL/uL (ref 3.87–5.11)
RDW: 16 % — ABNORMAL HIGH (ref 11.5–15.5)
WBC: 9.3 10*3/uL (ref 4.0–10.5)
nRBC: 0 % (ref 0.0–0.2)

## 2022-02-24 LAB — HCG, QUANTITATIVE, PREGNANCY: hCG, Beta Chain, Quant, S: 143689 m[IU]/mL — ABNORMAL HIGH (ref <5)

## 2022-02-24 LAB — LIPASE, BLOOD: Lipase: 37 U/L (ref 11–51)

## 2022-02-24 MED ORDER — LACTATED RINGERS IV BOLUS
1000.0000 mL | Freq: Once | INTRAVENOUS | Status: AC
  Administered 2022-02-24: 1000 mL via INTRAVENOUS

## 2022-02-24 MED ORDER — FAMOTIDINE 20 MG PO TABS
20.0000 mg | ORAL_TABLET | Freq: Once | ORAL | Status: AC
Start: 2022-02-24 — End: 2022-02-24
  Administered 2022-02-24: 20 mg via ORAL
  Filled 2022-02-24: qty 1

## 2022-02-24 MED ORDER — DOXYLAMINE-PYRIDOXINE 10-10 MG PO TBEC: 1.0000 | DELAYED_RELEASE_TABLET | Freq: Every day | ORAL | 0 refills | Status: AC

## 2022-02-24 MED ORDER — FAMOTIDINE 20 MG PO TABS: 20.0000 mg | ORAL_TABLET | Freq: Two times a day (BID) | ORAL | 0 refills | Status: AC

## 2022-02-24 MED ORDER — PROMETHAZINE HCL 25 MG PO TABS
25.0000 mg | ORAL_TABLET | Freq: Once | ORAL | Status: AC
Start: 2022-02-24 — End: 2022-02-24
  Administered 2022-02-24: 25 mg via ORAL
  Filled 2022-02-24: qty 1

## 2022-02-24 MED ORDER — PROMETHAZINE HCL 25 MG PO TABS: 25.0000 mg | ORAL_TABLET | Freq: Four times a day (QID) | ORAL | 0 refills | Status: AC | PRN

## 2022-02-24 NOTE — ED Provider Notes (Signed)
[facility]-EMERGENCY DEPT Provider Note   CSN: [id number] Arrival date & time: 02/24/22  0114     History Chief Complaint  Patient presents with   Abdominal Pain    Gloria Harper is a 26 y.o. female G2P1 reportedly around [redacted] weeks gestation with LMP being 12-24-2021 presents to the ED for evaluation of nausea, vomiting, and abdominal pain.  Patient reports she has been having some nausea and for the past week.  She reported her vomiting started yesterday and she is had 4 episodes of nonbloody emesis.  She reported that with the vomiting she had some pain that went from suprapubic to epigastric that was burning in nature.  She reported that yesterday she had some vaginal spotting whenever she wiped there was some red/pink tinge to her tissue paper.  She denies any spotting on her underwear or any gush of fluid.  She denies any dysuria or hematuria.  Denies any diarrhea or fever.  She mentions she has been constipated.  She has an appointment for her urine initial visit.  In the past with her previous pregnancy she had placenta previa and was concerned for that.  She denies any medical or surgical history medications.  No drug allergies.  Denies any tobacco, EtOH, illicit drug use ever.   Abdominal Pain Associated symptoms: constipation, nausea, vaginal bleeding and vomiting   Associated symptoms: no chest pain, no chills, no diarrhea, no dysuria, no fever, no hematuria, no shortness of breath and no vaginal discharge        Home Medications Prior to Admission medications   Medication Sig Start Date End Date Taking? Authorizing Provider  benzonatate (TESSALON) 100 MG capsule Take 1 capsule (100 mg total) by mouth every 8 (eight) hours as needed for cough. 08/12/21   LampteyBritta Mccreedy, MD  brompheniramine-pseudoephedrine-DM 30-2-10 MG/5ML syrup Take 10 mLs by mouth 3 (three) times daily as needed. 01/20/21   Wieters, Hallie C, PA-C  cetirizine (ZYRTEC) 10 MG tablet Take 1  tablet (10 mg total) by mouth daily for 10 days. 08/12/21 08/22/21  Merrilee Jansky, MD  fluticasone (FLONASE) 50 MCG/ACT nasal spray Place 1 spray into both nostrils daily for 3 days. 08/12/21 08/15/21  LampteyBritta Mccreedy, MD  lidocaine (HM LIDOCAINE PATCH) 4 % Place 1 patch onto the skin daily. 02/05/22   Achille Rich, PA-C      Allergies    Patient has no known allergies.    Review of Systems   Review of Systems  Constitutional:  Negative for chills and fever.  Respiratory:  Negative for shortness of breath.   Cardiovascular:  Negative for chest pain.  Gastrointestinal:  Positive for abdominal pain, constipation, nausea and vomiting. Negative for diarrhea.  Genitourinary:  Positive for vaginal bleeding. Negative for dysuria, hematuria, pelvic pain, vaginal discharge and vaginal pain.  Musculoskeletal:  Negative for back pain.  Neurological:  Negative for headaches.    Physical Exam Updated Vital Signs BP 126/71 (BP Location: Right Arm)   Pulse 76   Temp 98.9 F (37.2 C) (Oral)   Resp 18   Ht 5\' 8"  (1.727 m)   Wt 90.7 kg   LMP 12/24/2021 (Approximate)   SpO2 100%   BMI 30.41 kg/m  Physical Exam Vitals and nursing note reviewed.  Constitutional:      General: She is not in acute distress.    Appearance: Normal appearance. She is not toxic-appearing.  HENT:     Head: Normocephalic and atraumatic.  Eyes:  General: No scleral icterus. Cardiovascular:     Rate and Rhythm: Normal rate and regular rhythm.  Pulmonary:     Effort: Pulmonary effort is normal.     Breath sounds: Normal breath sounds.  Abdominal:     General: Bowel sounds are normal.     Palpations: Abdomen is soft.     Tenderness: There is no abdominal tenderness. There is no guarding or rebound.     Comments: Abdominal discomfort not reproducible or worse with palpation.   Musculoskeletal:        General: No deformity.     Cervical back: Normal range of motion.  Skin:    General: Skin is warm and dry.   Neurological:     General: No focal deficit present.     Mental Status: She is alert. Mental status is at baseline.     ED Results / Procedures / Treatments   Labs (all labs ordered are listed, but only abnormal results are displayed) Labs Reviewed  COMPREHENSIVE METABOLIC PANEL - Abnormal; Notable for the following components:      Result Value   CO2 19 (*)    All other components within normal limits  CBC - Abnormal; Notable for the following components:   Hemoglobin 11.9 (*)    RDW 16.0 (*)    All other components within normal limits  HCG, QUANTITATIVE, PREGNANCY - Abnormal; Notable for the following components:   hCG, Beta Chain, Quant, S [id number] (*)    All other components within normal limits  LIPASE, BLOOD  URINALYSIS, ROUTINE W REFLEX MICROSCOPIC  RH IG WORKUP (INCLUDES ABO/RH)    EKG None  Radiology US OB Comp Less 14 Wks  Result Date: 02/24/2022 CLINICAL DATA:  26 year old female with vomiting and vaginal bleeding in the 1st trimester of pregnancy. Quantitative beta HCG [id number]. EXAM: OBSTETRIC <14 WK US AND TRANSVAGINAL OB US TECHNIQUE: Both transabdominal and transvaginal ultrasound examinations were performed for complete evaluation of the gestation as well as the maternal uterus, adnexal regions, and pelvic cul-de-sac. Transvaginal technique was performed to assess early pregnancy. COMPARISON:  None relevant. FINDINGS: Intrauterine gestational sac: Single Yolk sac:  Visible Embryo:  Visible Cardiac Activity: Detected Heart Rate: 174 bpm CRL:  17.9 mm   8 w   2 d                  US EDC: 10/04/2022 Subchorionic hemorrhage: Small volume subchorionic hemorrhage suspected (image 55 series 1). Maternal uterus/adnexae: Right ovary appears normal measuring 4.1 x 2.4 x 3.6 cm. The left ovary appears normal measuring 4.1 x 1 point 1 x 1.7 cm. Only trace pelvic free fluid in the cul-de-sac (image 67). IMPRESSION: 1. Single living IUP demonstrated with estimated gestational age  of [redacted] weeks and 2 days by crown-rump length. 2. Small volume subchorionic hemorrhage.  Trace pelvic free fluid. Electronically Signed   By: Odessa FlemingH  Hall M.D.   On: 02/24/2022 06:15   US OB Transvaginal  Result Date: 02/24/2022 CLINICAL DATA:  26 year old female with vomiting and vaginal bleeding in the 1st trimester of pregnancy. Quantitative beta HCG [id number]. EXAM: OBSTETRIC <14 WK US AND TRANSVAGINAL OB US TECHNIQUE: Both transabdominal and transvaginal ultrasound examinations were performed for complete evaluation of the gestation as well as the maternal uterus, adnexal regions, and pelvic cul-de-sac. Transvaginal technique was performed to assess early pregnancy. COMPARISON:  None relevant. FINDINGS: Intrauterine gestational sac: Single Yolk sac:  Visible Embryo:  Visible Cardiac Activity: Detected Heart Rate: 174 bpm CRL:  17.9 mm   8 w   2 d                  [place] EDC: 10/04/2022 Subchorionic hemorrhage: Small volume subchorionic hemorrhage suspected (image 55 series 1). Maternal uterus/adnexae: Right ovary appears normal measuring 4.1 x 2.4 x 3.6 cm. The left ovary appears normal measuring 4.1 x 1 point 1 x 1.7 cm. Only trace pelvic free fluid in the cul-de-sac (image 67). IMPRESSION: 1. Single living IUP demonstrated with estimated gestational age of [redacted] weeks and 2 days by crown-rump length. 2. Small volume subchorionic hemorrhage.  Trace pelvic free fluid. Electronically Signed   By: Odessa Fleming M.D.   On: 02/24/2022 06:15    Procedures Procedures   Medications Ordered in ED Medications - No data to display  ED Course/ Medical Decision Making/ A&P                           Medical Decision Making Amount and/or Complexity of Data Reviewed Labs: ordered.  Risk Prescription drug management.   26 year old female presents the emergency department for evaluation of nausea, vomiting, abdominal pain, and vaginal spotting in pregnancy.  Differential diagnosis includes was not limited to ectopic pregnancy,  threatened abortion, spontaneous abortion, hyperemesis gravidarum, electrolyte abnormality dehydration, UTI, round ligament pain.  Vital signs are unremarkable.  Patient normotensive, afebrile, normal pulse rate, satting well room air without any increased work of breathing.  Physical exam is pertinent for well appearing patient in no acute distress. .  I independently reviewed and interpreted the patient's labs.  CBC shows mild anemia with a hemoglobin 11.9 this appears to be patient's baseline.  No leukocytosis.  CMP shows mild drop in bicarb at 19 otherwise normal gap, no LFT or electrolyte abnormality.  Lipase is normal.  hCG is consistent with gestation at [phone].  Patient is O+ and will not need the RhoGAM shot.  Urinalysis shows hazy urine with 20 ketones. No leukocytes, nitrates, bacteria, or WBCs noted.  Ultrasound shows single living IUP demonstrated the estimated gestational age of [redacted] weeks and 2 days by CRL.  Small volume subchorionic hemorrhage.  Trace pelvic free fluid.  Given the patient has not given [place] a urine sample in 7 hours, will order her a bag of LR fluid for possible dehydration.  Spoke with Erin the MAU APP I discussed the patient's presentation, physical exam, and ultrasound findings.  She reports that she may be bleeding for the next few weeks, and instructed that I gave her strict return precautions, however she can follow-up with her already scheduled OB appointment.  Recommended to send the patient home with Phenergan and Reglan as opposed to Diclegis since she is having active vomiting. Given the reassuring [place], she suggested pepcid as this sounded more GERD related.   Phenergan was given per recommendation.  PO challenge provided and passed. After the fluids and medications, the patient reports that she is feeling much better.   Discussed MAU recommendations with the patient. Bleeding precautions as well as other return precautions and red flag symptoms. Discussed with  her her new prescriptions. She verbalized understanding and agreed to the plan. The patient is stable and being discharged home in good condition.   Final Clinical Impression(s) / ED Diagnoses Final diagnoses:  Subchorionic hemorrhage of placenta in first trimester, single or unspecified fetus  Nausea and vomiting, unspecified vomiting type    Rx / DC Orders ED Discharge Orders  Ordered    promethazine (PHENERGAN) 25 MG tablet  Every 6 hours PRN        02/24/22 1010    Doxylamine-Pyridoxine (DICLEGIS) 10-10 MG TBEC  Daily        02/24/22 1010    famotidine (PEPCID) 20 MG tablet  2 times daily        02/24/22 1010              Achille Rich, [place] 02/27/22 2043    Gloris Manchester, MD 03/03/22 [id number]

## 2022-02-24 NOTE — Discharge Instructions (Addendum)
You were seen in the ER for evaluation of your nausea, vomiting, abdominal pain, and slight vaginal bleeding during pregnancy.  Your labs show that you are slightly dehydrated otherwise unremarkable.  Your ultrasound shows a intrauterine pregnancy with heartbeat.  Ultrasound shows you do have a small subchorionic bleed.  I have included more information on those into the discharge paperwork.  I have included Phenergan to take if you are still vomiting.  If you are no longer vomiting, I have also prescribed some Diclegis just for you to take for nausea.  Please make sure you are following up with your OB appointment scheduled later this month.  Make sure you are taking Miralax for soft stools to prevent constipation. If you have any worsening abdominal pain, nausea vomiting uncontrolled with the medication, or worsening vaginal bleeding, more than 1 pad an hour, please return to the nearest emergency department for reevaluation.  Contact a health care provider if: You have any vaginal bleeding. You have a fever. Get help right away if: You have severe cramps in your stomach, back, abdomen, or pelvis. You pass large clots or tissue. Save any tissue for your health care provider to look at. You faint. You become light-headed or weak.

## 2022-02-24 NOTE — ED Triage Notes (Signed)
Pt reports n/v/abd pain with green emesis starting today, pt reports she is about [redacted] weeks pregnant

## 2024-02-10 IMAGING — US US OB COMP LESS 14 WK
2 series · 15 of 28 positions shown · non-contrast
Comparison: None relevant.

CLINICAL DATA: 25-year-old female with vomiting and vaginal
bleeding in the 1st trimester of pregnancy. Quantitative beta HCG
[DATE].

EXAM:
OBSTETRIC <14 WK US AND TRANSVAGINAL OB US
TECHNIQUE: Both transabdominal and transvaginal ultrasound examinations were
performed for complete evaluation of the gestation as well as the
maternal uterus, adnexal regions, and pelvic cul-de-sac.
Transvaginal technique was performed to assess early pregnancy.

[Series 1: us ob comp less 14 wks mc & wl · 13 of 87 slices shown (1 of 2)]
[im 1/87]
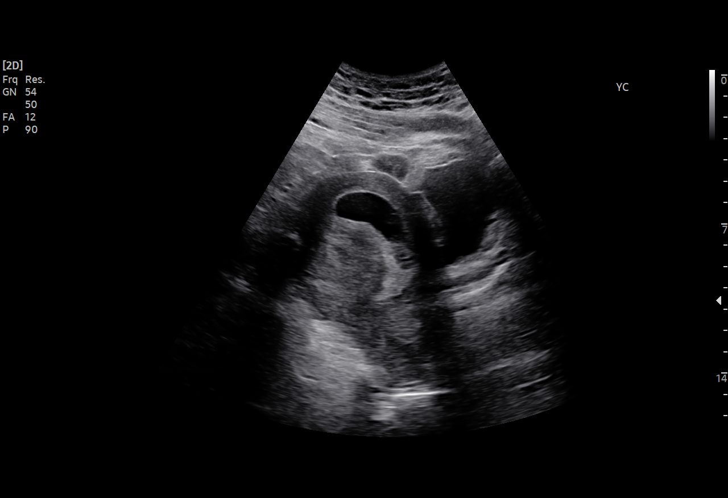
[im 8/87]
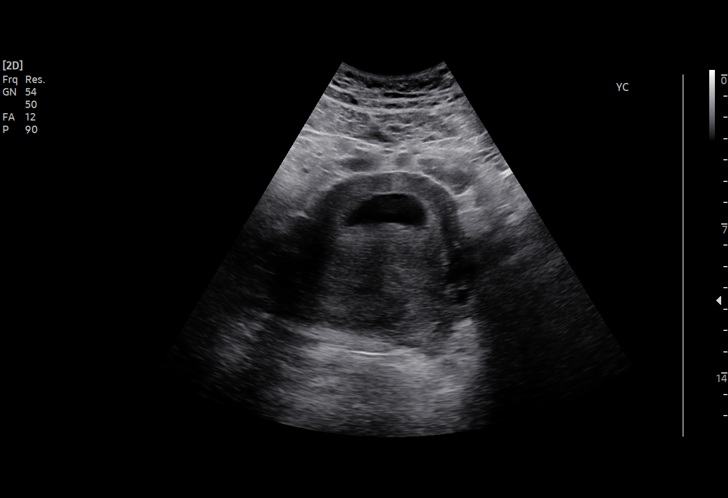
[im 15/87]
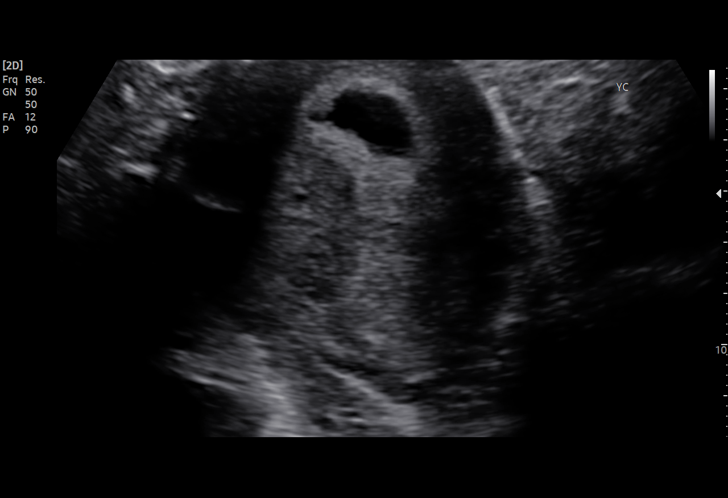
[im 23/87]
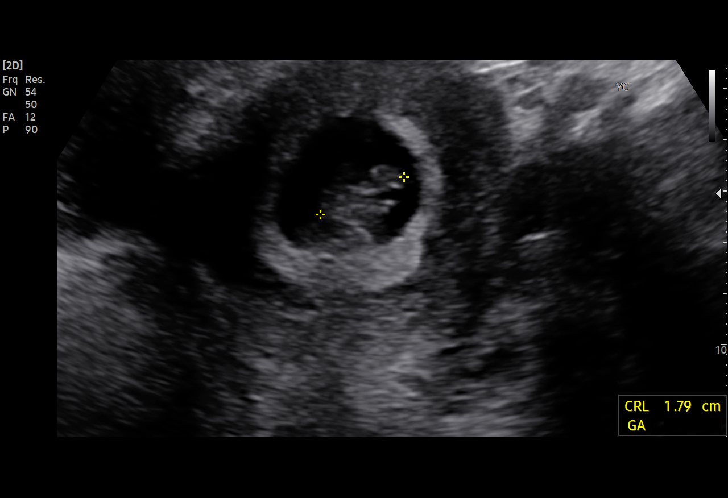
[im 30/87]
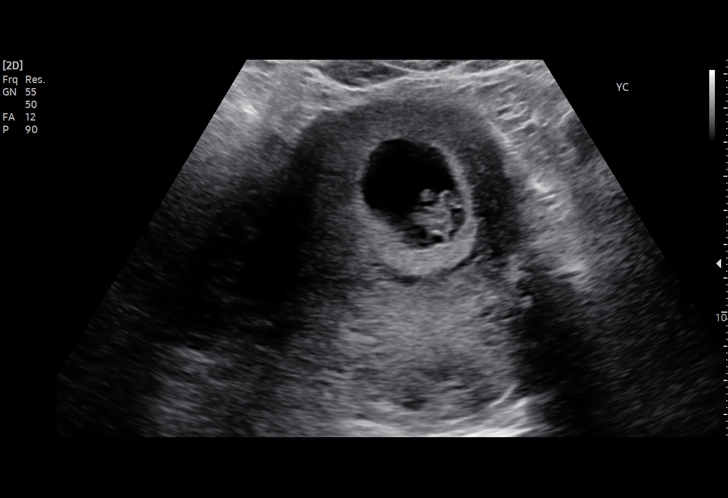
[im 38/87]
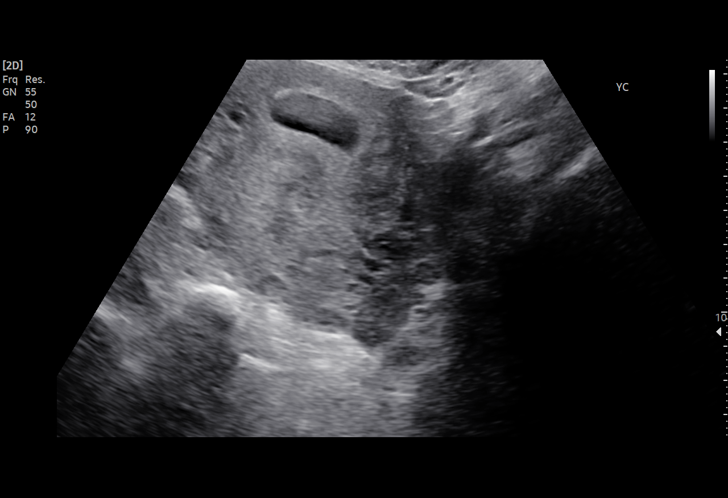
[im 45/87]
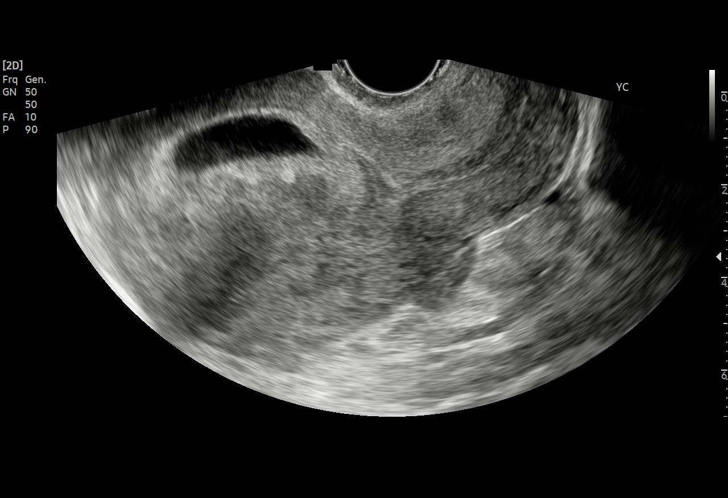
[im 53/87]
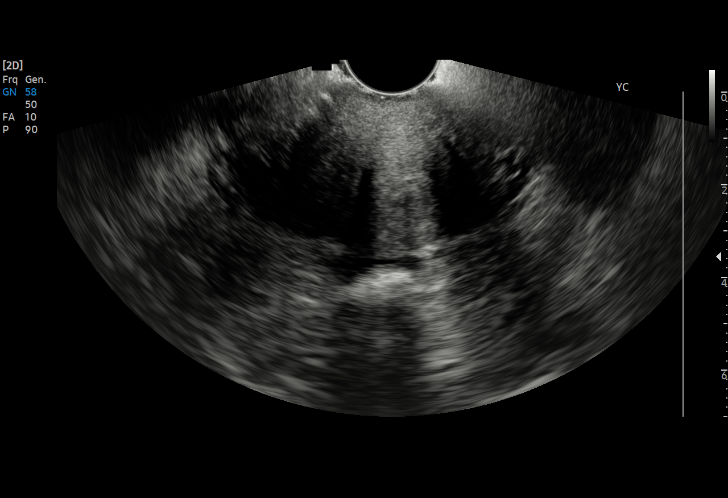
[im 57/87]
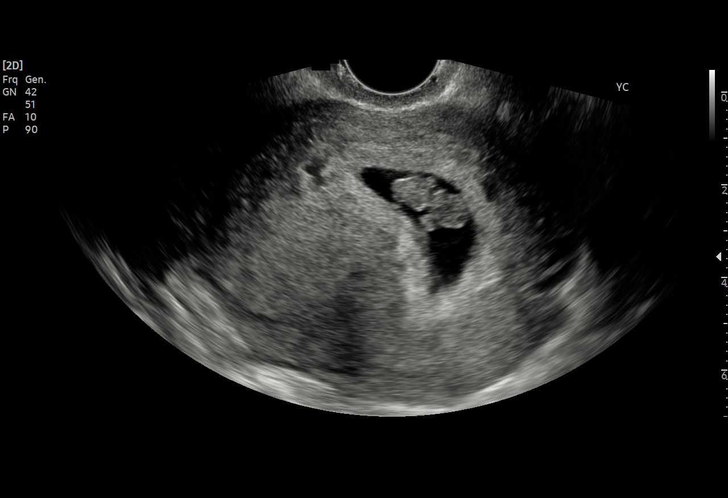
[im 64/87]
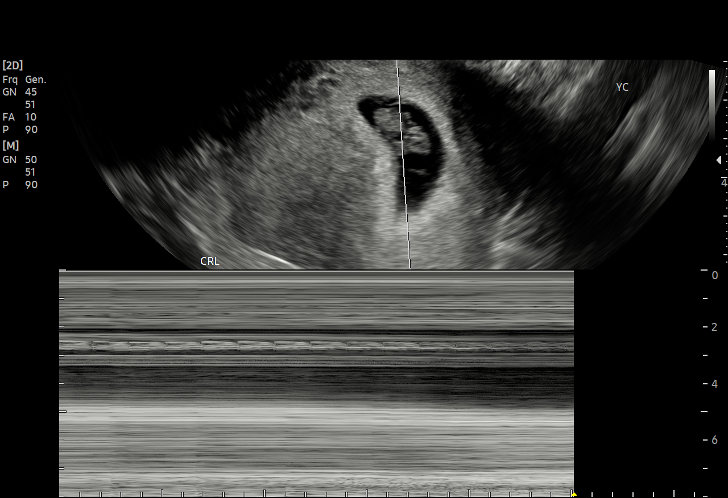
[im 72/87]
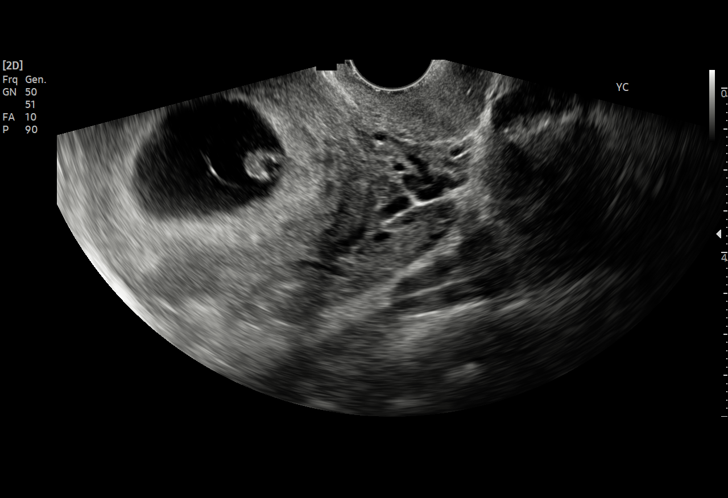
[im 79/87]
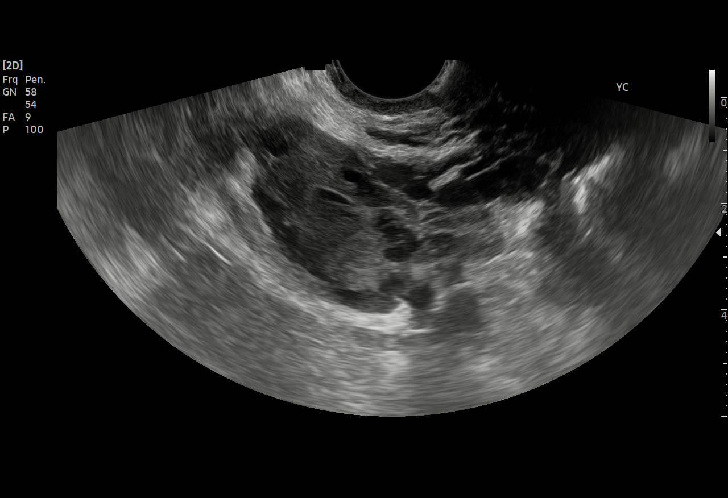
[im 87/87]
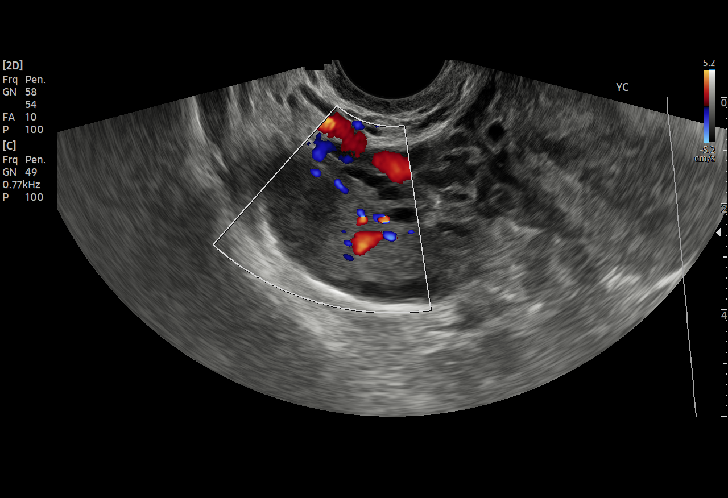

[Series 3: us ob comp less 14 wks mc & wl · 2 of 13 slices shown (2 of 2)]
[im 5/13]
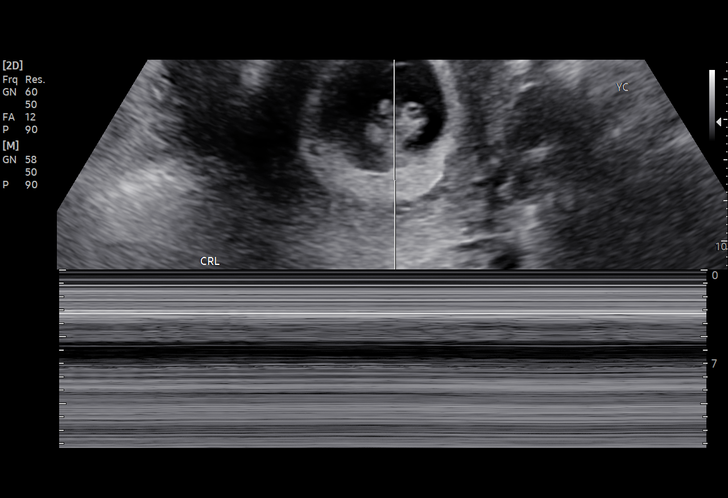
[im 13/13]
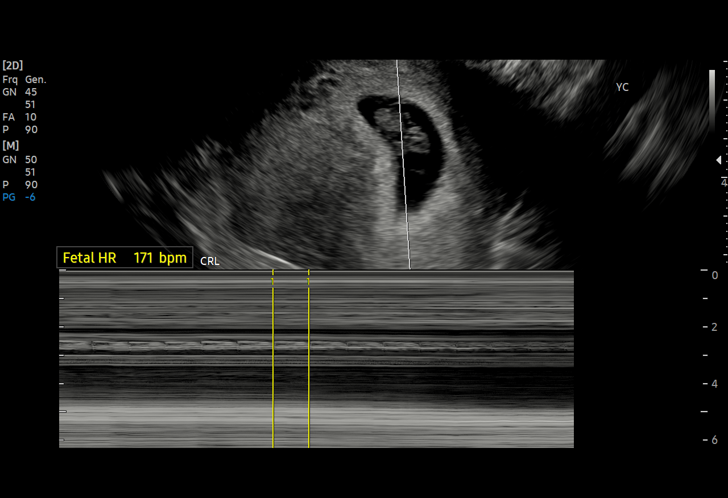

[15 of 28 positions shown; findings below may reference images not displayed]

FINDINGS: Intrauterine gestational sac: Single

Yolk sac:  Visible

Embryo:  Visible

Cardiac Activity: Detected

Heart Rate: 174 bpm

CRL:  17.9 mm   8 w   2 d                  US EDC: 10/04/2022

Subchorionic hemorrhage: Small volume subchorionic hemorrhage
suspected (image 55 series 1).

Maternal uterus/adnexae: Right ovary appears normal measuring 4.1 x
2.4 x 3.6 cm. The left ovary appears normal measuring 4.1 x 1 point
1 x 1.7 cm. Only trace pelvic free fluid in the cul-de-sac (image
67).
IMPRESSION: 1. Single living IUP demonstrated with estimated gestational age of
8 weeks and 2 days by crown-rump length.
2. Small volume subchorionic hemorrhage.  Trace pelvic free fluid.
